# Patient Record
Sex: Female | Born: 1975 | Race: White | Hispanic: No | Marital: Single | Smoking: Never smoker
Health system: Southern US, Community
[De-identification: ages and names within clinical notes are randomized; demographics above are authoritative.]

## PROBLEM LIST (undated history)

## (undated) DIAGNOSIS — F32A Depression, unspecified: Secondary | ICD-10-CM

## (undated) DIAGNOSIS — F419 Anxiety disorder, unspecified: Secondary | ICD-10-CM

## (undated) DIAGNOSIS — D649 Anemia, unspecified: Secondary | ICD-10-CM

## (undated) DIAGNOSIS — N3090 Cystitis, unspecified without hematuria: Secondary | ICD-10-CM

## (undated) DIAGNOSIS — F329 Major depressive disorder, single episode, unspecified: Secondary | ICD-10-CM

## (undated) DIAGNOSIS — M26629 Arthralgia of temporomandibular joint, unspecified side: Secondary | ICD-10-CM

## (undated) DIAGNOSIS — A6009 Herpesviral infection of other urogenital tract: Secondary | ICD-10-CM

## (undated) DIAGNOSIS — F909 Attention-deficit hyperactivity disorder, unspecified type: Secondary | ICD-10-CM

## (undated) HISTORY — DX: Anxiety disorder, unspecified: F41.9

## (undated) HISTORY — DX: Depression, unspecified: F32.A

## (undated) HISTORY — PX: WISDOM TOOTH EXTRACTION: SHX21

## (undated) HISTORY — DX: Anemia, unspecified: D64.9

## (undated) HISTORY — DX: Cystitis, unspecified without hematuria: N30.90

## (undated) HISTORY — DX: Major depressive disorder, single episode, unspecified: F32.9

## (undated) HISTORY — DX: Attention-deficit hyperactivity disorder, unspecified type: F90.9

## (undated) HISTORY — DX: Herpesviral infection of other urogenital tract: A60.09

## (undated) HISTORY — DX: Arthralgia of temporomandibular joint, unspecified side: M26.629

---

## 2004-11-19 ENCOUNTER — Encounter: Admission: RE | Admit: 2004-11-19 | Discharge: 2004-11-19 | Payer: Self-pay | Admitting: Family Medicine

## 2004-12-12 ENCOUNTER — Other Ambulatory Visit: Admission: RE | Admit: 2004-12-12 | Discharge: 2004-12-12 | Payer: Self-pay | Admitting: Family Medicine

## 2005-05-04 HISTORY — PX: COLONOSCOPY: SHX174

## 2005-12-28 ENCOUNTER — Ambulatory Visit: Payer: Self-pay | Admitting: Family Medicine

## 2006-03-10 ENCOUNTER — Ambulatory Visit: Payer: Self-pay | Admitting: Family Medicine

## 2006-03-10 ENCOUNTER — Other Ambulatory Visit: Admission: RE | Admit: 2006-03-10 | Discharge: 2006-03-10 | Payer: Self-pay | Admitting: Family Medicine

## 2006-09-15 ENCOUNTER — Ambulatory Visit: Payer: Self-pay | Admitting: Family Medicine

## 2006-09-16 ENCOUNTER — Ambulatory Visit: Payer: Self-pay | Admitting: Family Medicine

## 2007-05-16 ENCOUNTER — Ambulatory Visit: Payer: Self-pay | Admitting: Family Medicine

## 2007-05-16 ENCOUNTER — Other Ambulatory Visit: Admission: RE | Admit: 2007-05-16 | Discharge: 2007-05-16 | Payer: Self-pay | Admitting: Family Medicine

## 2007-12-14 ENCOUNTER — Ambulatory Visit: Payer: Self-pay | Admitting: Family Medicine

## 2008-03-01 ENCOUNTER — Ambulatory Visit: Payer: Self-pay | Admitting: Family Medicine

## 2008-04-10 ENCOUNTER — Ambulatory Visit: Payer: Self-pay | Admitting: Family Medicine

## 2008-05-04 HISTORY — PX: KNEE SURGERY: SHX244

## 2008-08-09 ENCOUNTER — Other Ambulatory Visit: Admission: RE | Admit: 2008-08-09 | Discharge: 2008-08-09 | Payer: Self-pay | Admitting: Family Medicine

## 2008-08-09 ENCOUNTER — Ambulatory Visit: Payer: Self-pay | Admitting: Family Medicine

## 2008-08-13 ENCOUNTER — Encounter: Admission: RE | Admit: 2008-08-13 | Discharge: 2008-08-13 | Payer: Self-pay | Admitting: Family Medicine

## 2009-01-14 ENCOUNTER — Ambulatory Visit: Payer: Self-pay | Admitting: Family Medicine

## 2009-03-08 ENCOUNTER — Encounter: Admission: RE | Admit: 2009-03-08 | Discharge: 2009-03-08 | Payer: Self-pay | Admitting: Family Medicine

## 2009-03-27 ENCOUNTER — Ambulatory Visit: Payer: Self-pay | Admitting: Family Medicine

## 2009-06-18 ENCOUNTER — Ambulatory Visit: Payer: Self-pay | Admitting: Family Medicine

## 2009-07-03 ENCOUNTER — Ambulatory Visit: Payer: Self-pay | Admitting: Physician Assistant

## 2009-10-22 ENCOUNTER — Other Ambulatory Visit: Admission: RE | Admit: 2009-10-22 | Discharge: 2009-10-22 | Payer: Self-pay | Admitting: Family Medicine

## 2009-10-22 ENCOUNTER — Ambulatory Visit: Payer: Self-pay | Admitting: Physician Assistant

## 2009-11-25 ENCOUNTER — Encounter: Admission: RE | Admit: 2009-11-25 | Discharge: 2009-11-25 | Payer: Self-pay | Admitting: Oral Surgery

## 2010-04-16 ENCOUNTER — Ambulatory Visit: Payer: Self-pay | Admitting: Family Medicine

## 2010-05-27 ENCOUNTER — Ambulatory Visit
Admission: RE | Admit: 2010-05-27 | Discharge: 2010-05-27 | Payer: Self-pay | Source: Home / Self Care | Attending: Family Medicine | Admitting: Family Medicine

## 2010-07-29 ENCOUNTER — Ambulatory Visit (INDEPENDENT_AMBULATORY_CARE_PROVIDER_SITE_OTHER): Payer: 59 | Admitting: Family Medicine

## 2010-07-29 DIAGNOSIS — R6884 Jaw pain: Secondary | ICD-10-CM

## 2010-08-25 ENCOUNTER — Other Ambulatory Visit (HOSPITAL_COMMUNITY)
Admission: RE | Admit: 2010-08-25 | Discharge: 2010-08-25 | Disposition: A | Discharge status: Home / Self Care | Payer: 59 | Source: Ambulatory Visit | Attending: Family Medicine | Admitting: Family Medicine

## 2010-08-25 ENCOUNTER — Ambulatory Visit (INDEPENDENT_AMBULATORY_CARE_PROVIDER_SITE_OTHER): Payer: 59 | Admitting: Family Medicine

## 2010-08-25 ENCOUNTER — Other Ambulatory Visit: Payer: Self-pay | Admitting: Family Medicine

## 2010-08-25 DIAGNOSIS — N921 Excessive and frequent menstruation with irregular cycle: Secondary | ICD-10-CM

## 2010-08-25 DIAGNOSIS — N760 Acute vaginitis: Secondary | ICD-10-CM

## 2010-08-25 DIAGNOSIS — Z124 Encounter for screening for malignant neoplasm of cervix: Secondary | ICD-10-CM | POA: Insufficient documentation

## 2010-08-26 LAB — HM PAP SMEAR: HM Pap smear: NEGATIVE

## 2010-11-19 ENCOUNTER — Encounter: Payer: Self-pay | Admitting: Family Medicine

## 2010-11-20 ENCOUNTER — Encounter: Payer: Self-pay | Admitting: Family Medicine

## 2010-11-20 ENCOUNTER — Ambulatory Visit (INDEPENDENT_AMBULATORY_CARE_PROVIDER_SITE_OTHER): Payer: 59 | Admitting: Family Medicine

## 2010-11-20 VITALS — BP 122/70 | HR 68 | Ht 65.0 in | Wt 176.0 lb

## 2010-11-20 DIAGNOSIS — R6884 Jaw pain: Secondary | ICD-10-CM

## 2010-11-20 DIAGNOSIS — S0300XA Dislocation of jaw, unspecified side, initial encounter: Secondary | ICD-10-CM | POA: Insufficient documentation

## 2010-11-20 DIAGNOSIS — F329 Major depressive disorder, single episode, unspecified: Secondary | ICD-10-CM

## 2010-11-20 DIAGNOSIS — F3289 Other specified depressive episodes: Secondary | ICD-10-CM | POA: Insufficient documentation

## 2010-11-20 MED ORDER — HYDROCODONE-ACETAMINOPHEN 5-500 MG PO TABS: 1.0000 | ORAL_TABLET | Freq: Four times a day (QID) | ORAL | Status: AC | PRN

## 2010-11-20 MED ORDER — NAPROXEN 500 MG PO TABS: 500.0000 mg | ORAL_TABLET | Freq: Two times a day (BID) | ORAL | Status: AC

## 2010-11-20 NOTE — Patient Instructions (Signed)
Ask your TMJ physician if he has any recommendations for physical therapists in town for TMJ treatment.  Stop all pain medications that are over the counter while taking the prescription anti-inflammatories.  You may use Tylenol products along with the anti-inflammatory.  I'm giving you a prescription for a narcotic--use this just for severe pain. It may cause sedation, as well as constipation and other side effects--do not take this while working, and don't take tylenol along with this pain medication (it contains acetominophen).  Consider switching from Prozac to Cymbalta.  You would need to stop the Prozac for 7-10 days before changing medication ("washout period").  We would start the Cymbalta at 30mg  for at least a week, and then increase to 60mg .  We can get you started on Samples if you would like.  Anti-inflammatories-->physical therapy-->possible injection-->(surgery)

## 2010-11-20 NOTE — Progress Notes (Signed)
Patient presents for follow up on Prozac.  This was started in 04/2010, and finds that it is working well.  At the time she felt overwhelmed with life stressors (job, family).  Currently denies anxiety, having some issues with feeling down related to ongoing pain from TMJ, and a little from work.  Denies any side effects to the medication.  Patient has had ongoing problems with TMJ.  Has been having pain L>R.  Has tried many therapies--has been to ENT, TMJ specialist (Dr. Lawerance Bach), 2 oral surgeons (Dr. Warren Danes, Dr. Barbette Merino).  Has not yet had PT, injections or surgery.  She has been eating soft food diet x 6 months, even eating baby food.  Ibuprofen and Aleve don't help (hasn't tried rx equivalent dose).  Patient appears quite frustrated due to chronic pain.    Past Medical History  Diagnosis Date  . Anxiety   . TMJ pain dysfunction syndrome   . Herpes genitalis in women     Past Surgical History  Procedure Date  . Knee surgery 2010    arthroscopic and lateral release of L knee  . Colonoscopy 2007    Dr. Elnoria Howard    History   Social History  . Marital Status: Single    Spouse Name: N/A    Number of Children: 0  . Years of Education: N/A   Occupational History  . 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 Smoking status: Never Smoker   . Smokeless tobacco: Never Used  . Alcohol Use: Yes     3 drinks per week.  . Drug Use: No  . Sexually Active: Yes    Birth Control/ Protection: Condom   Other Topics Concern  . Not on file   Social History Narrative  . No narrative on file    Family History  Problem Relation Age of Onset  . Arthritis Maternal Grandmother   . Diabetes Maternal Grandmother   . Alzheimer's disease Maternal Grandmother   . Arthritis Maternal Grandfather   . Arthritis Paternal Grandmother   . Arthritis Paternal Grandfather   . Cancer Father     CLL and skin cancer    Current outpatient prescriptions:FLUoxetine  (PROZAC) 20 MG tablet, Take 20 mg by mouth daily.  , Disp: , Rfl: ;  HYDROcodone-acetaminophen (VICODIN) 5-500 MG per tablet, Take 1 tablet by mouth every 6 (six) hours as needed for pain., Disp: 30 tablet, Rfl: 0;  naproxen (NAPROSYN) 500 MG tablet, Take 1 tablet (500 mg total) by mouth 2 (two) times daily with a meal., Disp: 30 tablet, Rfl: 2  No Known Allergies  ROS: Denies fevers, weight changes, URI symptoms, chest pain, Nausea, vomiting, abdominal pain, or other concerns  PHYSICAL EXAM: BP 122/70  Pulse 68  Ht 5\' 5"  (1.651 m)  Wt 176 lb (79.833 kg)  BMI 29.29 kg/m2  LMP 11/12/2010 Pleasant female in no distress, in fire department uniform Psych: normal mood, affect, hygiene, grooming.  Normal speech and eye contact HEENT: PERRL, EOMI, conjunctiva clear.  TM's tender bilaterally, no popping, but decreased ROM.  Also some tenderness over masseter muscles Neck: no lymphadenopathy or mass  ASSESSMENT/PLAN: 1. TMJ (dislocation of temporomandibular joint)  naproxen (NAPROSYN) 500 MG tablet, HYDROcodone-acetaminophen (VICODIN) 5-500 MG per tablet  2. Depressive disorder, not elsewhere classified     Discussed continuing Prozac for depression.  Recommended considering trial of changing to Cymbalta (after washout period of Prozac) to see if she gets pain relief.  Discussed Rx strength  NSAID's and PT.  She will call her TMJ doctor to get physical therapist recommendation.  If no effect from these therapies, we discussed possibility of injections (to be done by her TMJ doctor), versus surgery; she prefers to avoid the latter two options, if possible.  Reviewed NSAID precautions.  Discussed risks of narcotics, and to use sparingly, and not while on the job.  30 minutes visit, almost all of which was counseling

## 2010-11-25 ENCOUNTER — Ambulatory Visit: Payer: Self-pay | Admitting: Family Medicine

## 2011-07-17 ENCOUNTER — Other Ambulatory Visit: Payer: Self-pay | Admitting: Family Medicine

## 2011-07-17 NOTE — Telephone Encounter (Signed)
Is this ok?

## 2011-12-22 ENCOUNTER — Encounter: Payer: Self-pay | Admitting: Obstetrics and Gynecology

## 2011-12-22 ENCOUNTER — Ambulatory Visit (INDEPENDENT_AMBULATORY_CARE_PROVIDER_SITE_OTHER): Payer: 59 | Admitting: Obstetrics and Gynecology

## 2011-12-22 VITALS — BP 110/70 | Ht 65.0 in | Wt 175.0 lb

## 2011-12-22 DIAGNOSIS — Z01419 Encounter for gynecological examination (general) (routine) without abnormal findings: Secondary | ICD-10-CM

## 2011-12-22 MED ORDER — NYSTATIN-TRIAMCINOLONE 100000-0.1 UNIT/GM-% EX OINT: TOPICAL_OINTMENT | Freq: Three times a day (TID) | CUTANEOUS | Status: AC | PRN

## 2011-12-22 MED ORDER — AMBULATORY NON FORMULARY MEDICATION: 1.0000 | Status: DC

## 2011-12-22 NOTE — Progress Notes (Signed)
The patient reports:normal menses, no abnormal bleeding, pelvic pain or discharge  Contraception:condoms  Last mammogram: patient has never had a mammogram  Last pap: was normal April  2012  GC/Chlamydia cultures offered: declined HIV/RPR/HbsAg offered:  declined HSV 1 and 2 glycoprotein offered: declined  Menstrual cycle regular and monthly: Yes Menstrual flow normal: Yes  Urinary symptoms: none Normal bowel movements: Yes Reports abuse at home: No:   Subjective:    Cheryl Woodard is a 36 y.o. female, G0P0, who presents for an annual exam. Referred by Loma [place]. Concerned about repetitive yeast infection with discharge, burning and irritation almost monthly. Also reports "cysts" in vulva.    History   Social History  . Marital Status: Single    Spouse Name: N/A    Number of Children: 0  . Years of Education: N/A   Occupational History  . 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 Smoking status: Never Smoker   . Smokeless tobacco: Never Used  . Alcohol Use: Yes     3 drinks per week.  . Drug Use: No  . Sexually Active: Yes    Birth Control/ Protection: Condom   Other Topics Concern  . None   Social History Narrative  . None    Menstrual cycle:   LMP: Patient's last menstrual period was 12/11/2011.           Cycle: normal, mild dysmenorrhea, some positional dyspareunia  The following portions of the patient's history were reviewed and updated as appropriate: allergies, current medications, past family history, past medical history, past social history, past surgical history and problem list.  Review of Systems Pertinent items are noted in HPI. Breast:Negative for breast lump,nipple discharge or nipple retraction Gastrointestinal: Negative for abdominal pain, change in bowel habits or rectal bleeding Urinary:negative   Objective:    BP 110/70  Ht 5\' 5"  (1.651 m)  Wt 175 lb (79.379 kg)  BMI 29.12 kg/m2  LMP 12/11/2011     Weight:  Wt Readings from Last 1 Encounters:  12/22/11 175 lb (79.379 kg)          BMI: Body mass index is 29.12 kg/(m^2).  General Appearance: Alert, appropriate appearance for age. No acute distress HEENT: Grossly normal Neck / Thyroid: Supple, no masses, nodes or enlargement Lungs: clear to auscultation bilaterally Back: No CVA tenderness Breast Exam: No masses or nodes.No dimpling, nipple retraction or discharge. Cardiovascular: Regular rate and rhythm. S1, S2, no murmur Gastrointestinal: Soft, non-tender, no masses or organomegaly Pelvic Exam: Vulva and vagina appear normal. Bimanual exam reveals normal uterus and adnexa. Rectovaginal: not indicated Lymphatic Exam: Non-palpable nodes in neck, clavicular, axillary, or inguinal regions Skin: no rash or abnormalities Neurologic: Normal gait and speech, no tremor  Psychiatric: Alert and oriented, appropriate affect.     Assessment:    Normal gyn exam recurrent yeast infection vs herpes outbreak    Plan:    pap smear return annually or prn STD screening: declined Contraception:no method Yeast recommendations reviewed: if failed, consider Valtrex daily      Lev Cervone AMD

## 2011-12-23 LAB — PAP IG W/ RFLX HPV ASCU

## 2012-02-24 ENCOUNTER — Ambulatory Visit (INDEPENDENT_AMBULATORY_CARE_PROVIDER_SITE_OTHER): Payer: 59 | Admitting: Obstetrics and Gynecology

## 2012-02-24 ENCOUNTER — Telehealth: Payer: Self-pay | Admitting: Obstetrics and Gynecology

## 2012-02-24 ENCOUNTER — Encounter: Payer: Self-pay | Admitting: Obstetrics and Gynecology

## 2012-02-24 VITALS — BP 100/68 | Temp 99.0°F | Wt 174.0 lb

## 2012-02-24 DIAGNOSIS — N762 Acute vulvitis: Secondary | ICD-10-CM

## 2012-02-24 DIAGNOSIS — N760 Acute vaginitis: Secondary | ICD-10-CM

## 2012-02-24 LAB — POCT WET PREP (WET MOUNT)
Trichomonas Wet Prep HPF POC: NEGATIVE
Whiff Test: NEGATIVE

## 2012-02-24 MED ORDER — VALACYCLOVIR HCL 500 MG PO TABS: ORAL_TABLET | ORAL | Status: DC

## 2012-02-24 NOTE — Patient Instructions (Signed)
Cetaphil Hydrating Body Lotion Wash Cerave Hydrating Cleanser

## 2012-02-24 NOTE — Progress Notes (Signed)
36 YO for possible HSV outbreak states that her outside vaginal area is always irritated, especially after intercourse.  Has been diagnosed with vaginal  eczema with some relief from steroid creams. For past 3 years has had this problem and vaginal dryness.    O: Pelvic; EGBUS-left inferior labia with cluster of tender vesicles (intact), vagina-white discharge, cervix-no lesions, uterus/adnexae-no masses/tenderness  Wet Prep: pH-4.5,  whiff-negative,  no clue, trichomoniasis or yeast  A:  HSV Outbreak      Vaginal Dryness   P:  Will discuss possible vaginal E2, colposcopy etc. with Dr.Rivard        Trial of Valtrex 500 mg bid x 5 days then qd x 3 months to see if       symptoms resolve.         Reviewed perineal hygiene-patient is compliant        RTO-TBA  Cheryl Feider, PA-C

## 2012-02-26 ENCOUNTER — Telehealth: Payer: Self-pay

## 2012-02-26 LAB — HERPES SIMPLEX VIRUS CULTURE: Organism ID, Bacteria: DETECTED

## 2012-02-26 NOTE — Telephone Encounter (Signed)
LM FOR PT TO CALL BACK CONCERNING HSV1.

## 2012-02-26 NOTE — Telephone Encounter (Signed)
TC TO PT REGARDING HSV 1 RESULTS. 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 PT VOICED UNDERSTANDING.

## 2012-02-26 NOTE — Telephone Encounter (Signed)
Message copied by Winfred Leeds on Fri Feb 26, 2012  2:54 PM ------      Message from: Henreitta Leber      Created: Fri Feb 26, 2012  1:07 PM       Advise patient of HSV-1 culture results.  Thanks,  EP

## 2013-01-09 ENCOUNTER — Other Ambulatory Visit: Payer: Self-pay | Admitting: Occupational Medicine

## 2013-01-09 ENCOUNTER — Ambulatory Visit
Admission: RE | Admit: 2013-01-09 | Discharge: 2013-01-09 | Disposition: A | Discharge status: Home / Self Care | Payer: Self-pay | Source: Ambulatory Visit | Attending: Occupational Medicine | Admitting: Occupational Medicine

## 2013-01-09 DIAGNOSIS — T148XXA Other injury of unspecified body region, initial encounter: Secondary | ICD-10-CM

## 2013-03-13 DIAGNOSIS — F909 Attention-deficit hyperactivity disorder, unspecified type: Secondary | ICD-10-CM

## 2013-03-13 HISTORY — DX: Attention-deficit hyperactivity disorder, unspecified type: F90.9

## 2013-03-17 ENCOUNTER — Encounter: Payer: Self-pay | Admitting: Internal Medicine

## 2014-03-15 ENCOUNTER — Other Ambulatory Visit: Payer: Self-pay | Admitting: Physician Assistant

## 2014-04-17 ENCOUNTER — Emergency Department (HOSPITAL_COMMUNITY)
Admission: EM | Admit: 2014-04-17 | Discharge: 2014-04-17 | Disposition: A | Discharge status: Home / Self Care | Payer: 59 | Source: Home / Self Care | Attending: Family Medicine | Admitting: Family Medicine

## 2014-04-17 ENCOUNTER — Encounter (HOSPITAL_COMMUNITY): Payer: Self-pay | Admitting: Emergency Medicine

## 2014-04-17 DIAGNOSIS — J00 Acute nasopharyngitis [common cold]: Secondary | ICD-10-CM

## 2014-04-17 DIAGNOSIS — H109 Unspecified conjunctivitis: Secondary | ICD-10-CM

## 2014-04-17 MED ORDER — POLYMYXIN B-TRIMETHOPRIM 10000-0.1 UNIT/ML-% OP SOLN
1.0000 [drp] | OPHTHALMIC | Status: DC
Start: 2014-04-17 — End: 2020-12-16

## 2014-04-17 NOTE — Discharge Instructions (Signed)
Your eye symptoms are likely related to your cold. If symptoms become worse or discharge develops throughout the day, please begin using medication as prescribed.  Conjunctivitis Conjunctivitis is commonly called "pink eye." Conjunctivitis can be caused by bacterial or viral infection, allergies, or injuries. There is usually redness of the lining of the eye, itching, discomfort, and sometimes discharge. There may be deposits of matter along the eyelids. A viral infection usually causes a watery discharge, while a bacterial infection causes a yellowish, thick discharge. Pink eye is very contagious and spreads by direct contact. You may be given antibiotic eyedrops as part of your treatment. Before using your eye medicine, remove all drainage from the eye by washing gently with warm water and cotton balls. Continue to use the medication until you have awakened 2 mornings in a row without discharge from the eye. Do not rub your eye. This increases the irritation and helps spread infection. Use separate towels from other household members. Wash your hands with soap and water before and after touching your eyes. Use cold compresses to reduce pain and sunglasses to relieve irritation from light. Do not wear contact lenses or wear eye makeup until the infection is gone. SEEK MEDICAL CARE IF:   Your symptoms are not better after 3 days of treatment.  You have increased pain or trouble seeing.  The outer eyelids become very red or swollen. Document Released: 05/28/2004 Document Revised: 07/13/2011 Document Reviewed: 04/20/2005 [facility] Patient Information 2015 [place], [place]. This information is not intended to replace advice given to you by your health care provider. Make sure you discuss any questions you have with your health care provider.  Upper Respiratory Infection, Adult An upper respiratory infection (URI) is also sometimes known as the common cold. The upper respiratory tract includes the nose,  sinuses, throat, trachea, and bronchi. Bronchi are the airways leading to the lungs. Most people improve within 1 week, but symptoms can last up to 2 weeks. A residual cough may last even longer.  CAUSES Many different viruses can infect the tissues lining the upper respiratory tract. The tissues become irritated and inflamed and often become very moist. Mucus production is also common. A cold is contagious. You can easily spread the virus to others by oral contact. This includes kissing, sharing a glass, coughing, or sneezing. Touching your mouth or nose and then touching a surface, which is then touched by another person, can also spread the virus. SYMPTOMS  Symptoms typically develop 1 to 3 days after you come in contact with a cold virus. Symptoms vary from person to person. They may include:  Runny nose.  Sneezing.  Nasal congestion.  Sinus irritation.  Sore throat.  Loss of voice (laryngitis).  Cough.  Fatigue.  Muscle aches.  Loss of appetite.  Headache.  Low-grade fever. DIAGNOSIS  You might diagnose your own cold based on familiar symptoms, since most people get a cold 2 to 3 times a year. Your caregiver can confirm this based on your exam. Most importantly, your caregiver can check that your symptoms are not due to another disease such as strep throat, sinusitis, pneumonia, asthma, or epiglottitis. Blood tests, throat tests, and X-rays are not necessary to diagnose a common cold, but they may sometimes be helpful in excluding other more serious diseases. Your caregiver will decide if any further tests are required. RISKS AND COMPLICATIONS  You may be at risk for a more severe case of the common cold if you smoke cigarettes, have chronic heart disease (  such as heart failure) or lung disease (such as asthma), or if you have a weakened immune system. The very young and very old are also at risk for more serious infections. Bacterial sinusitis, middle ear infections, and  bacterial pneumonia can complicate the common cold. The common cold can worsen asthma and chronic obstructive pulmonary disease (COPD). Sometimes, these complications can require emergency medical care and may be life-threatening. PREVENTION  The best way to protect against getting a cold is to practice good hygiene. Avoid oral or hand contact with people with cold symptoms. Wash your hands often if contact occurs. There is no clear evidence that vitamin C, vitamin E, echinacea, or exercise reduces the chance of developing a cold. However, it is always recommended to get plenty of rest and practice good nutrition. TREATMENT  Treatment is directed at relieving symptoms. There is no cure. Antibiotics are not effective, because the infection is caused by a virus, not by bacteria. Treatment may include:  Increased fluid intake. Sports drinks offer valuable electrolytes, sugars, and fluids.  Breathing heated mist or steam (vaporizer or shower).  Eating chicken soup or other clear broths, and maintaining good nutrition.  Getting plenty of rest.  Using gargles or lozenges for comfort.  Controlling fevers with ibuprofen or acetaminophen as directed by your caregiver.  Increasing usage of your inhaler if you have asthma. Zinc gel and zinc lozenges, taken in the first 24 hours of the common cold, can shorten the duration and lessen the severity of symptoms. Pain medicines may help with fever, muscle aches, and throat pain. A variety of non-prescription medicines are available to treat congestion and runny nose. Your caregiver can make recommendations and may suggest nasal or lung inhalers for other symptoms.  HOME CARE INSTRUCTIONS   Only take over-the-counter or prescription medicines for pain, discomfort, or fever as directed by your caregiver.  Use a warm mist humidifier or inhale steam from a shower to increase air moisture. This may keep secretions moist and make it easier to breathe.  Drink  enough water and fluids to keep your urine clear or pale yellow.  Rest as needed.  Return to work when your temperature has returned to normal or as your caregiver advises. You may need to stay home longer to avoid infecting others. You can also use a face mask and careful hand washing to prevent spread of the virus. SEEK MEDICAL CARE IF:   After the first few days, you feel you are getting worse rather than better.  You need your caregiver's advice about medicines to control symptoms.  You develop chills, worsening shortness of breath, or brown or red sputum. These may be signs of pneumonia.  You develop yellow or brown nasal discharge or pain in the face, especially when you bend forward. These may be signs of sinusitis.  You develop a fever, swollen neck glands, pain with swallowing, or white areas in the back of your throat. These may be signs of strep throat. SEEK IMMEDIATE MEDICAL CARE IF:   You have a fever.  You develop severe or persistent headache, ear pain, sinus pain, or chest pain.  You develop wheezing, a prolonged cough, cough up blood, or have a change in your usual mucus (if you have chronic lung disease).  You develop sore muscles or a stiff neck. Document Released: 10/14/2000 Document Revised: 07/13/2011 Document Reviewed: 07/26/2013 [facility] Patient Information 2015 [place], [place]. This information is not intended to replace advice given to you by your health care provider.  Make sure you discuss any questions you have with your health care provider. ° °

## 2014-04-17 NOTE — ED Provider Notes (Signed)
CSN: [id number]     Arrival date & time 04/17/14  1038 History   First MD Initiated Contact with Patient 04/17/14 1106     Chief Complaint  Patient presents with  . URI   (Consider location/radiation/quality/duration/timing/severity/associated sxs/prior Treatment) HPI Comments: PCP: none Otherwise healthy Non-smoker  Patient is a 38 y.o. female presenting with URI. The history is provided by the patient.  URI Presenting symptoms: congestion, cough, rhinorrhea and sore throat   Presenting symptoms: no fever   Severity:  Mild Onset quality:  Gradual Duration:  5 days Timing:  Constant Progression:  Improving Chronicity:  New Associated symptoms comment:  Bilateral eye irritation x 2 days   Past Medical History  Diagnosis Date  . Anxiety   . TMJ pain dysfunction syndrome   . 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 Bladder infection   . Anemia   . Depression   . ADHD (attention deficit hyperactivity disorder) 03/13/13   Past Surgical History  Procedure Laterality Date  . Knee surgery  2010    arthroscopic and lateral release of L knee  . Colonoscopy  2007    Dr. Benson Norway  . Wisdom tooth extraction     Family History  Problem Relation Age of Onset  . Arthritis Maternal Grandmother   . Diabetes Maternal Grandmother   . Alzheimer's disease Maternal Grandmother   . Arthritis Maternal Grandfather   . Arthritis Paternal Grandmother   . Depression Paternal Grandmother   . Arthritis Paternal Grandfather   . Cancer Father     CLL and skin cancer  . Hypertension Father   . Anemia Mother   . Scoliosis Sister    History  Substance Use Topics  . Smoking status: Never Smoker   . Smokeless tobacco: Never Used  . Alcohol Use: Yes     Comment: 3 drinks per week.   OB History    Gravida Para Term Preterm AB TAB SAB Ectopic Multiple Living   0              Review of Systems  Constitutional: Negative for fever.  HENT: Positive for congestion, rhinorrhea and sore throat.    Eyes: Positive for discharge and redness. Negative for photophobia, pain, itching and visual disturbance.  Respiratory: Positive for cough. Negative for chest tightness and shortness of breath.   Cardiovascular: Negative.     Allergies  Review of patient's allergies indicates no known allergies.  Home Medications   Prior to Admission medications   Medication Sig Start Date End Date Taking? Authorizing Provider  lisdexamfetamine (VYVANSE) 40 MG capsule Take 40 mg by mouth every morning.   Yes Historical Provider, MD  sertraline (ZOLOFT) 50 MG tablet Take 50 mg by mouth daily.   Yes Historical Provider, MD  AMBULATORY NON FORMULARY MEDICATION Place 1 suppository vaginally 2 (two) times a week. Medication Name: boric acid suppository 600mg  12/22/11   Delsa Bern, MD  b complex vitamins tablet Take 1 tablet by mouth daily.    Historical Provider, MD  FLUoxetine (PROZAC) 20 MG capsule TAKE ONE CAPSULE BY MOUTH ONE TIME DAILY 07/17/11   Camelia Eng Tysinger, PA-C  FLUoxetine (PROZAC) 20 MG tablet Take 20 mg by mouth daily.      Historical Provider, MD  magnesium 30 MG tablet Take 30 mg by mouth 2 (two) times daily.    Historical Provider, MD  Multiple Vitamin (MULTIVITAMIN) capsule Take 1 capsule by mouth daily.    Historical Provider, MD  trimethoprim-polymyxin b (POLYTRIM) ophthalmic solution Place 1 drop into both eyes every 4 (four) hours. X 5 days 04/17/14   Lutricia Feil, PA  valACYclovir (VALTREX) 500 MG tablet 1 po bid x 5 days then 1 po qd 02/24/12   Earnstine Regal, PA-C   BP 136/86 mmHg  Pulse 79  Temp(Src) 98.3 F (36.8 C) (Oral)  Resp 16  SpO2 100%  LMP 04/12/2014 Physical Exam  Constitutional: She is oriented to person, place, and time. She appears well-developed and well-nourished. No distress.  HENT:  Head: Normocephalic and atraumatic.  Right Ear: Hearing, tympanic membrane, external ear and ear canal normal.  Left Ear: Hearing, tympanic membrane, external ear and  ear canal normal.  Nose: Nose normal.  Mouth/Throat: Uvula is midline, oropharynx is clear and moist and mucous membranes are normal.  Eyes: Conjunctivae, EOM and lids are normal. Pupils are equal, round, and reactive to light.  Slit lamp exam:      The right eye shows no corneal abrasion, no corneal ulcer and no fluorescein uptake.       The left eye shows no corneal abrasion, no corneal ulcer and no fluorescein uptake.  Neck: Normal range of motion. Neck supple.  Cardiovascular: Normal rate, regular rhythm and normal heart sounds.   Pulmonary/Chest: Effort normal and breath sounds normal.  Musculoskeletal: Normal range of motion.  Lymphadenopathy:    She has no cervical adenopathy.  Neurological: She is alert and oriented to person, place, and time.  Skin: Skin is warm and dry. No rash noted. No erythema.  Psychiatric: She has a normal mood and affect. Her behavior is normal.  Nursing note and vitals reviewed.   ED Course  Procedures (including critical care time) Labs Review Labs Reviewed - No data to display  Imaging Review No results found.   MDM   1. Common cold   2. Bilateral conjunctivitis   Symptomatic care of resolving URI Common cold with very mild bilateral (likely viral) conjunctivitis. No contact lens use until symptoms resolved.  Advised to begin Polytrim opth if eye symptoms worsen or discharge increases.    Lutricia Feil, [place] 04/17/14 1149

## 2014-04-17 NOTE — ED Notes (Signed)
C/o cold sx onset 5 days Sx include: productive cough, congestion, runny nose Has been taking OTC cold meds w/no relief.  Also reports poss bilateral conjunctivitis onset 2 days Sx include: redness and irritations Alert, no signs of acute distress.

## 2014-06-12 DIAGNOSIS — F419 Anxiety disorder, unspecified: Secondary | ICD-10-CM | POA: Insufficient documentation

## 2014-06-12 DIAGNOSIS — F909 Attention-deficit hyperactivity disorder, unspecified type: Secondary | ICD-10-CM | POA: Insufficient documentation

## 2014-06-13 DIAGNOSIS — E559 Vitamin D deficiency, unspecified: Secondary | ICD-10-CM | POA: Insufficient documentation

## 2015-02-26 ENCOUNTER — Other Ambulatory Visit: Payer: Self-pay | Admitting: *Deleted

## 2015-02-26 ENCOUNTER — Other Ambulatory Visit: Payer: Self-pay | Admitting: Physician Assistant

## 2015-02-26 DIAGNOSIS — Z1231 Encounter for screening mammogram for malignant neoplasm of breast: Secondary | ICD-10-CM

## 2015-05-14 DIAGNOSIS — B009 Herpesviral infection, unspecified: Secondary | ICD-10-CM | POA: Insufficient documentation

## 2015-06-20 DIAGNOSIS — Z975 Presence of (intrauterine) contraceptive device: Secondary | ICD-10-CM | POA: Insufficient documentation

## 2016-06-30 ENCOUNTER — Other Ambulatory Visit: Payer: Self-pay | Admitting: Family Medicine

## 2016-06-30 DIAGNOSIS — Z1231 Encounter for screening mammogram for malignant neoplasm of breast: Secondary | ICD-10-CM

## 2016-07-17 ENCOUNTER — Ambulatory Visit
Admission: RE | Admit: 2016-07-17 | Discharge: 2016-07-17 | Disposition: A | Discharge status: Home / Self Care | Payer: BC Managed Care – PPO | Source: Ambulatory Visit | Attending: Family Medicine | Admitting: Family Medicine

## 2016-07-17 DIAGNOSIS — Z1231 Encounter for screening mammogram for malignant neoplasm of breast: Secondary | ICD-10-CM

## 2016-07-20 ENCOUNTER — Other Ambulatory Visit: Payer: Self-pay | Admitting: Family Medicine

## 2016-07-20 DIAGNOSIS — R928 Other abnormal and inconclusive findings on diagnostic imaging of breast: Secondary | ICD-10-CM

## 2016-07-23 ENCOUNTER — Ambulatory Visit
Admission: RE | Admit: 2016-07-23 | Discharge: 2016-07-23 | Disposition: A | Discharge status: Home / Self Care | Payer: BC Managed Care – PPO | Source: Ambulatory Visit | Attending: Family Medicine | Admitting: Family Medicine

## 2016-07-23 DIAGNOSIS — R928 Other abnormal and inconclusive findings on diagnostic imaging of breast: Secondary | ICD-10-CM

## 2017-05-21 ENCOUNTER — Other Ambulatory Visit: Payer: Self-pay | Admitting: Physician Assistant

## 2017-05-21 DIAGNOSIS — N6310 Unspecified lump in the right breast, unspecified quadrant: Secondary | ICD-10-CM

## 2017-07-19 ENCOUNTER — Ambulatory Visit
Admission: RE | Admit: 2017-07-19 | Discharge: 2017-07-19 | Disposition: A | Discharge status: Home / Self Care | Payer: BC Managed Care – PPO | Source: Ambulatory Visit | Attending: Physician Assistant | Admitting: Physician Assistant

## 2017-07-19 ENCOUNTER — Other Ambulatory Visit: Payer: Self-pay | Admitting: Physician Assistant

## 2017-07-19 DIAGNOSIS — N6310 Unspecified lump in the right breast, unspecified quadrant: Secondary | ICD-10-CM

## 2018-01-20 ENCOUNTER — Ambulatory Visit
Admission: RE | Admit: 2018-01-20 | Discharge: 2018-01-20 | Disposition: A | Discharge status: Home / Self Care | Payer: BC Managed Care – PPO | Source: Ambulatory Visit | Attending: Physician Assistant | Admitting: Physician Assistant

## 2018-01-20 ENCOUNTER — Other Ambulatory Visit: Payer: Self-pay | Admitting: Physician Assistant

## 2018-01-20 DIAGNOSIS — N6310 Unspecified lump in the right breast, unspecified quadrant: Secondary | ICD-10-CM

## 2018-07-25 ENCOUNTER — Other Ambulatory Visit: Payer: Self-pay

## 2019-02-07 IMAGING — MG DIGITAL DIAGNOSTIC BILATERAL MAMMOGRAM WITH TOMO AND CAD
8 series · 8 of 24 positions shown · non-contrast
Comparison: Previous exam(s).

CLINICAL DATA: Follow-up of right breast probably benign mass and
annual mammography.

EXAM:
DIGITAL DIAGNOSTIC BILATERAL MAMMOGRAM WITH CAD AND TOMO
ULTRASOUND RIGHT BREAST

[L CC synth-2D]
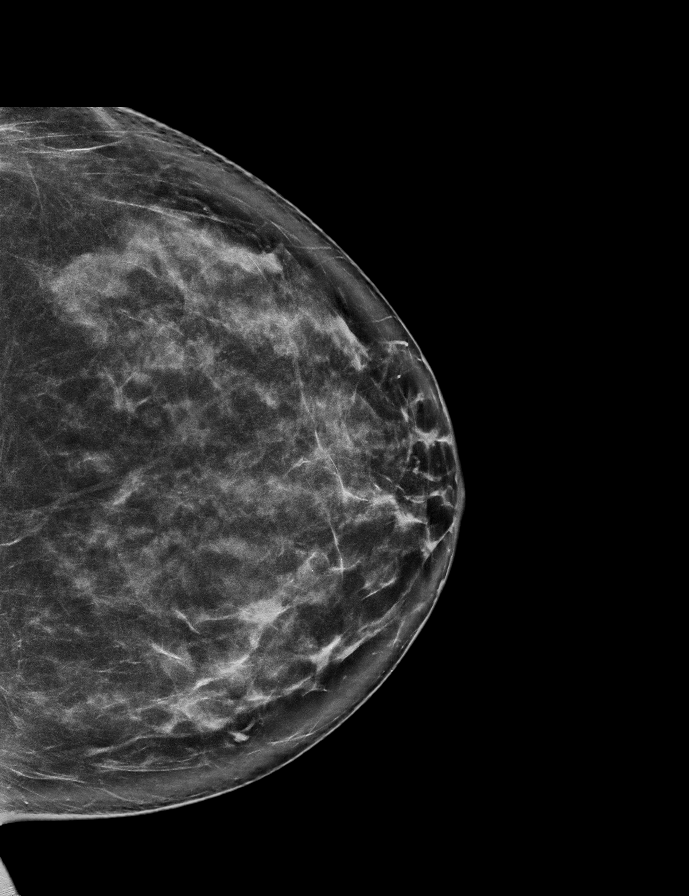

[R MLO synth-2D]
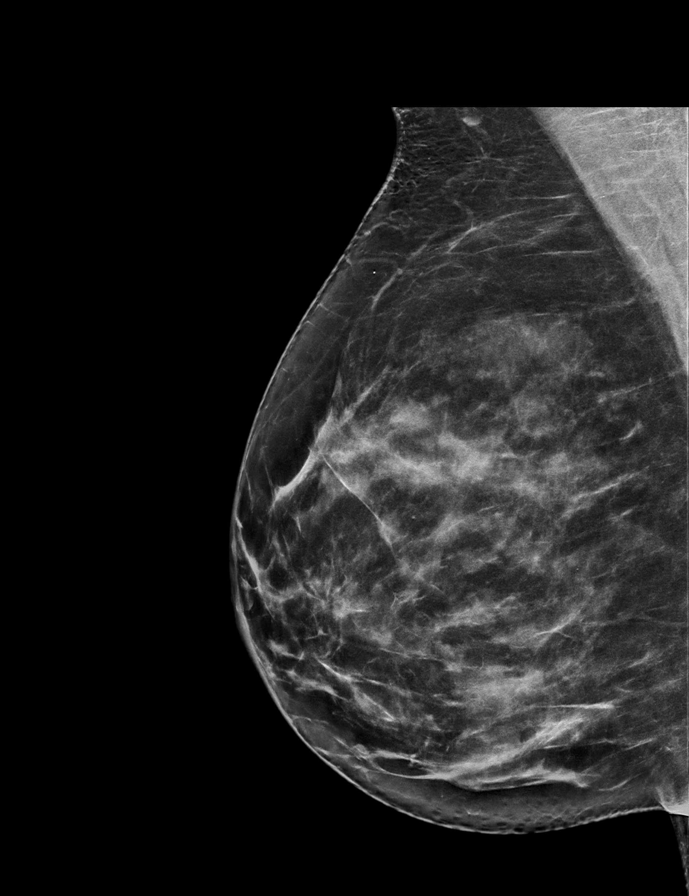

[R CC synth-2D]
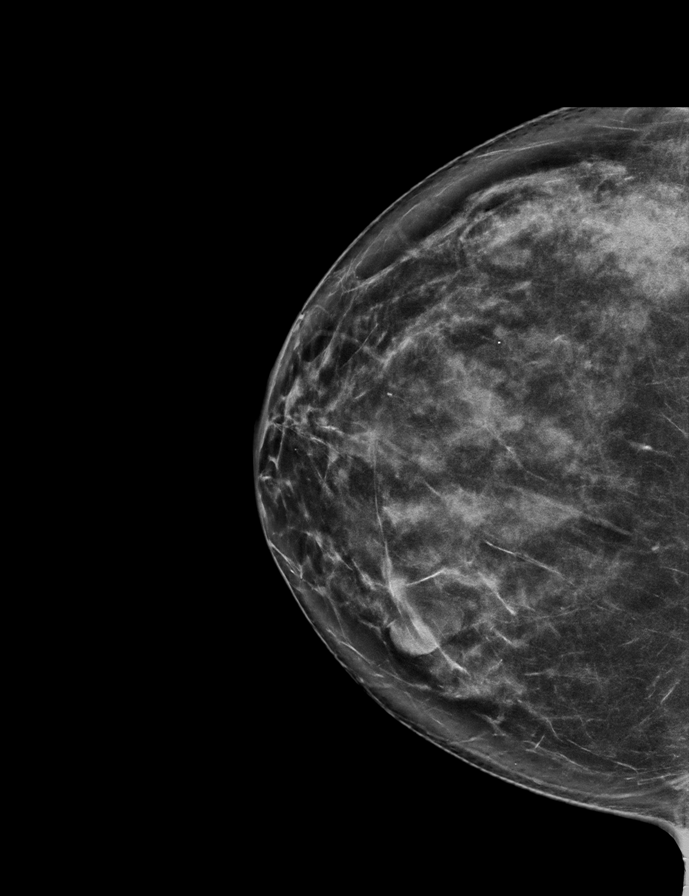

[L MLO synth-2D]
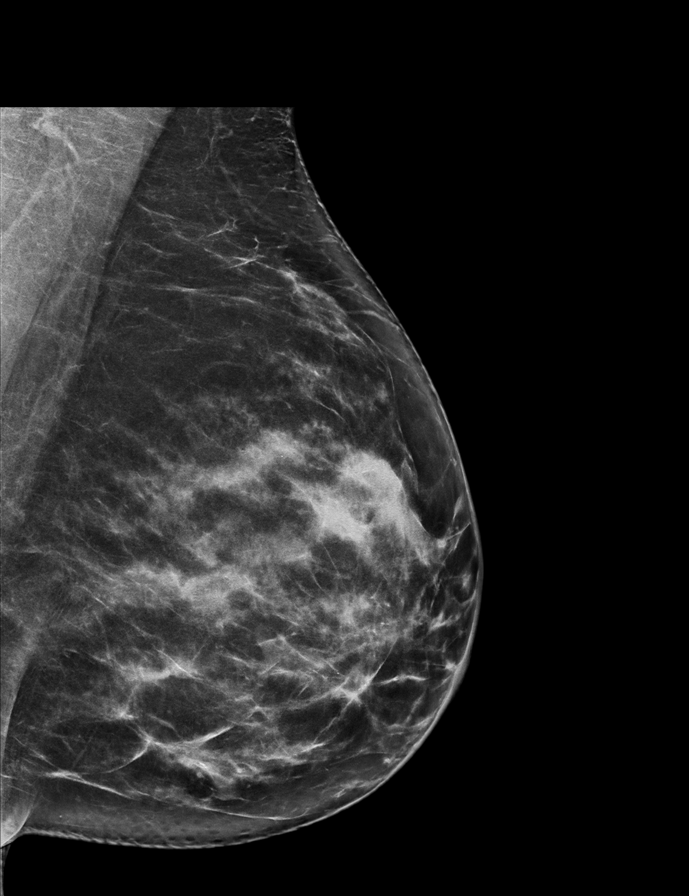

[R CC tomo · tomo slice 39/77.0]
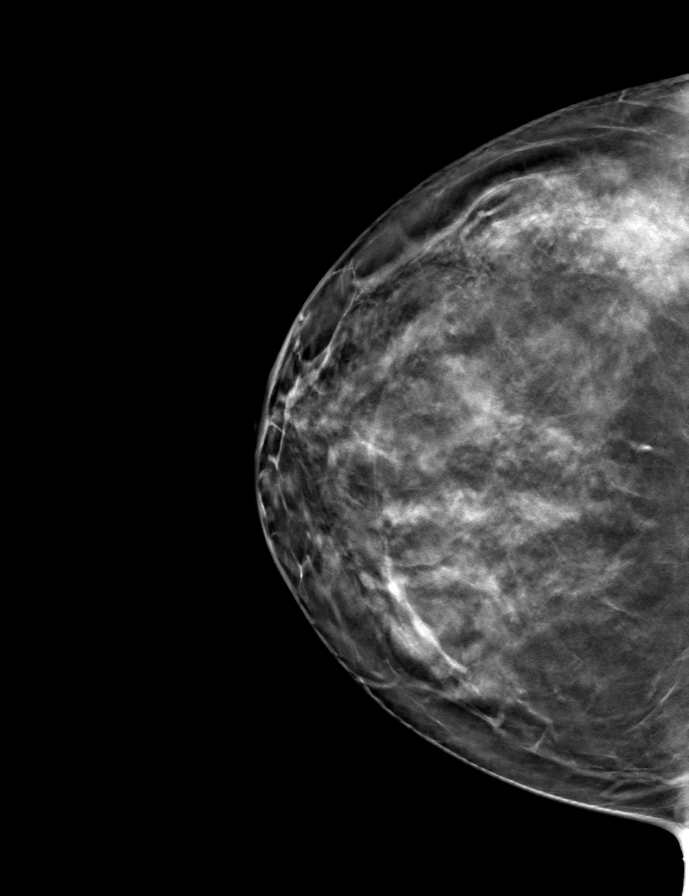

[L MLO tomo · tomo slice 39/78.0]
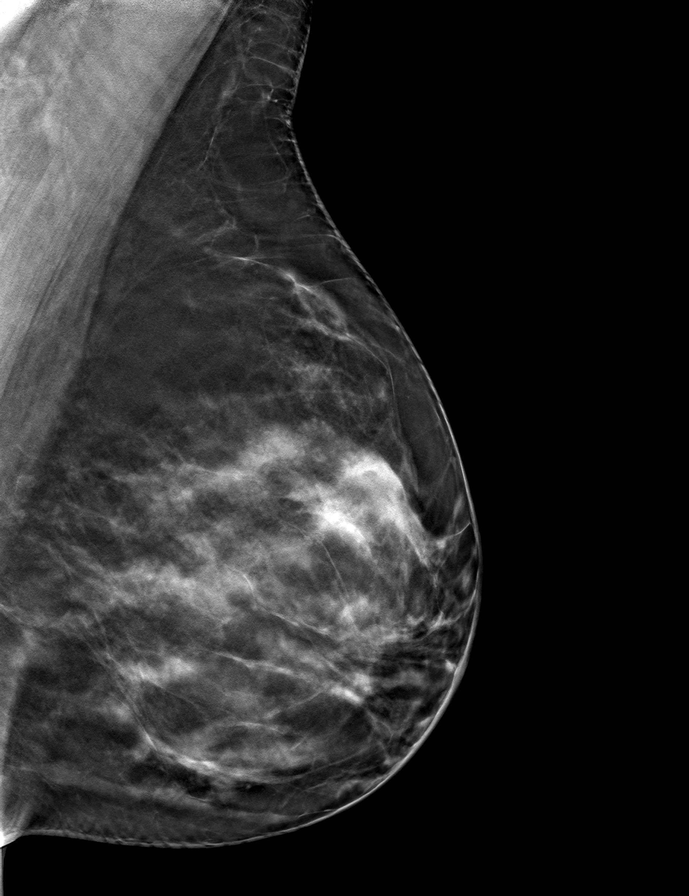

[L CC tomo · tomo slice 39/78.0]
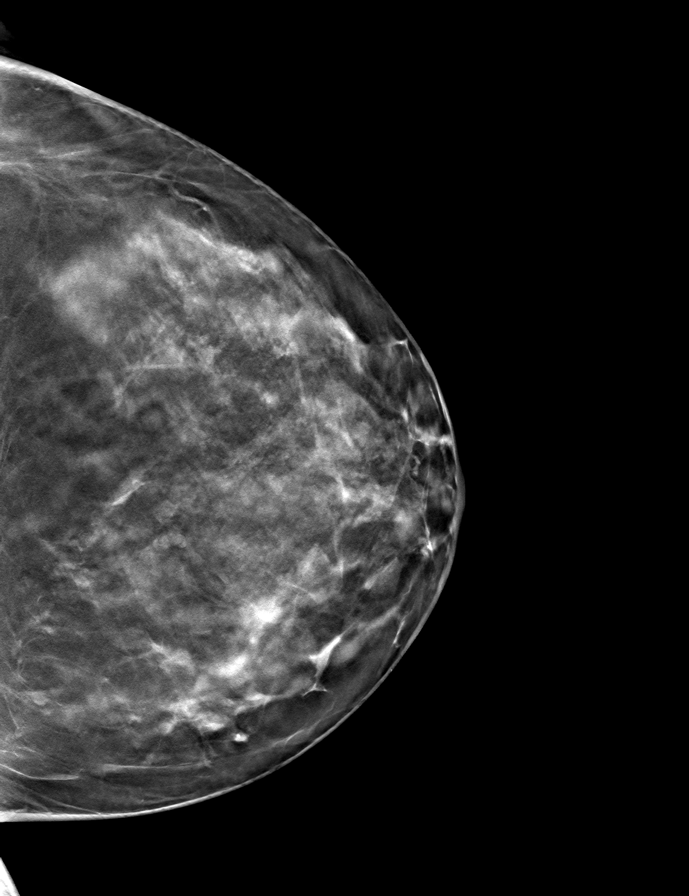

[R MLO tomo · tomo slice 39/78.0]
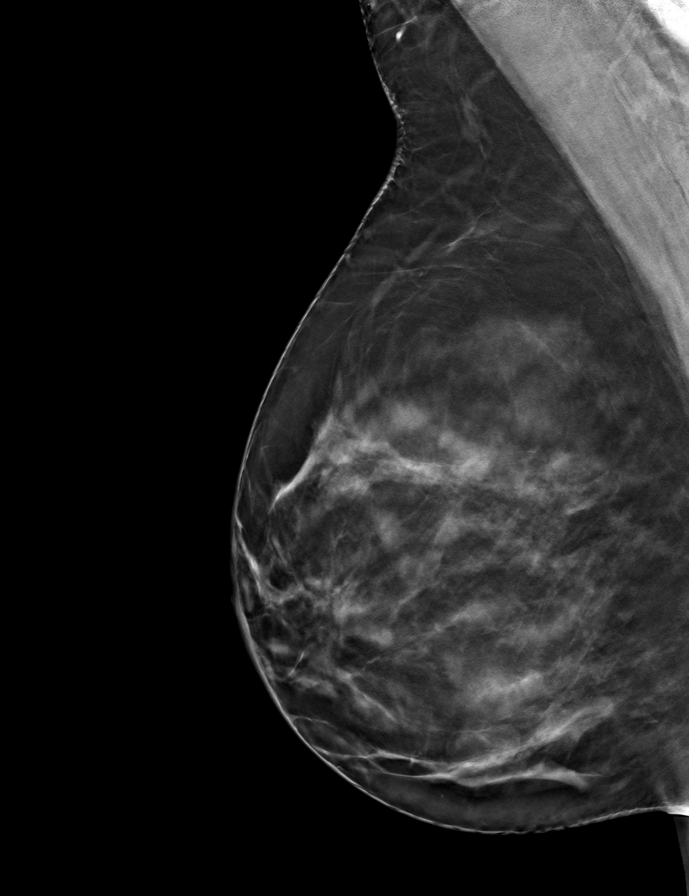

[8 of 24 positions shown; findings below may reference images not displayed]

ACR Breast Density Category c: The breast tissue is heterogeneously
dense, which may obscure small masses.
FINDINGS: Mammographically, there are no new suspicious masses, areas of
architectural distortion or microcalcifications in either breast.
There is a stable known circumscribed mass in the right breast lower
inner quadrant, middle depth.

Mammographic images were processed with CAD.

On physical exam, soft palpable approximately 1.5 cm mass in the
right slightly lower inner quadrant.

Targeted ultrasound is performed, showing right breast 3:30 o'clock
3 cm from the nipple stable benign-appearing hypoechoic mass
measuring 1.7 x 1.1 x 1.5 cm.
IMPRESSION: Stable benign-appearing right breast mass, for which continued
short-term follow-up is recommended.

RECOMMENDATION:
Diagnostic mammogram and possibly ultrasound of the right breast in
6 months. (Code:6O-3-H0O)

I have discussed the findings and recommendations with the patient.
Results were also provided in writing at the conclusion of the
visit. If applicable, a reminder letter will be sent to the patient
regarding the next appointment.

BI-RADS CATEGORY  3: Probably benign.

## 2019-07-08 ENCOUNTER — Ambulatory Visit: Payer: BC Managed Care – PPO | Attending: Internal Medicine

## 2019-07-08 DIAGNOSIS — Z23 Encounter for immunization: Secondary | ICD-10-CM | POA: Insufficient documentation

## 2019-07-08 NOTE — Progress Notes (Signed)
   Covid-19 Vaccination Clinic  Name:  Cheryl Woodard    MRN: [id number] DOB: [birth year]  07/08/2019  Cheryl Woodard was observed post Covid-19 immunization for 15 minutes without incident. She was provided with Vaccine Information Sheet and instruction to access the V-Safe system.   Cheryl Woodard was instructed to call 911 with any severe reactions post vaccine: Marland Kitchen Difficulty breathing  . Swelling of face and throat  . A fast heartbeat  . A bad rash all over body  . Dizziness and weakness   Immunizations Administered    Name Date Dose VIS Date Route   Pfizer COVID-19 Vaccine 07/08/2019 11:51 AM 0.3 mL 04/14/2019 Intramuscular   Manufacturer: [place]   Lot: [id number]   [place]: [id number]

## 2019-07-29 ENCOUNTER — Ambulatory Visit: Payer: BC Managed Care – PPO | Attending: Internal Medicine

## 2019-07-29 DIAGNOSIS — Z23 Encounter for immunization: Secondary | ICD-10-CM

## 2019-07-29 NOTE — Progress Notes (Signed)
   Covid-19 Vaccination Clinic  Name:  Cheryl Woodard    MRN: [id number] DOB: July 28, 1975  07/29/2019  [name] was observed post Covid-19 immunization for 15 minutes without incident. She was provided with Vaccine Information Sheet and instruction to access the V-Safe system.   [name] was instructed to call 911 with any severe reactions post vaccine: Marland Kitchen Difficulty breathing  . Swelling of face and throat  . A fast heartbeat  . A bad rash all over body  . Dizziness and weakness   Immunizations Administered    Name Date Dose VIS Date Route   Pfizer COVID-19 Vaccine 07/29/2019 10:12 AM 0.3 mL 04/14/2019 Intramuscular   Manufacturer: [employer]   Lot: [id number]   [place]: [id number]

## 2020-02-06 DIAGNOSIS — K602 Anal fissure, unspecified: Secondary | ICD-10-CM | POA: Insufficient documentation

## 2020-12-16 ENCOUNTER — Encounter: Payer: Self-pay | Admitting: Podiatry

## 2020-12-16 ENCOUNTER — Other Ambulatory Visit: Payer: Self-pay

## 2020-12-16 ENCOUNTER — Ambulatory Visit (INDEPENDENT_AMBULATORY_CARE_PROVIDER_SITE_OTHER): Payer: Self-pay | Admitting: Podiatry

## 2020-12-16 ENCOUNTER — Ambulatory Visit (INDEPENDENT_AMBULATORY_CARE_PROVIDER_SITE_OTHER): Payer: BC Managed Care – PPO

## 2020-12-16 DIAGNOSIS — K625 Hemorrhage of anus and rectum: Secondary | ICD-10-CM | POA: Insufficient documentation

## 2020-12-16 DIAGNOSIS — D3613 Benign neoplasm of peripheral nerves and autonomic nervous system of lower limb, including hip: Secondary | ICD-10-CM

## 2020-12-16 DIAGNOSIS — M778 Other enthesopathies, not elsewhere classified: Secondary | ICD-10-CM

## 2020-12-16 DIAGNOSIS — G5762 Lesion of plantar nerve, left lower limb: Secondary | ICD-10-CM

## 2020-12-16 MED ORDER — MELOXICAM 15 MG PO TABS: 15.0000 mg | ORAL_TABLET | Freq: Every day | ORAL | 0 refills | Status: AC

## 2020-12-20 NOTE — Progress Notes (Signed)
Subjective:   Patient ID: Cheryl Woodard, female   DOB: 45 y.o.   MRN: [id number]   HPI 45 year old female presents the office today with concerns of pain to her left foot and getting worse over last 2 months.  She states that it feels "jammed".  Previously has been swelling.  Previously she had been placed into a boot without any help when she saw orthopedics.  She was told that she may have had a fracture toe but the symptoms have continued.  No injury that she reports.  No other concerns.   Review of Systems  All other systems reviewed and are negative.  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 Benson [place]   KNEE SURGERY  2010   arthroscopic and lateral release of L knee   WISDOM TOOTH EXTRACTION       Current Outpatient Medications:    amphetamine-dextroamphetamine (ADDERALL XR) 20 MG 24 hr capsule, Take by mouth., Disp: , Rfl:    meloxicam (MOBIC) 15 MG tablet, Take 1 tablet (15 mg total) by mouth daily., Disp: 30 tablet, Rfl: 0   sertraline (ZOLOFT) 50 MG tablet, Take 50 mg by mouth daily., Disp: , Rfl:   No Known Allergies        Objective:  Physical Exam  General: AAO x3, NAD  Dermatological: Skin is warm, dry and supple bilateral. There are no open sores, no preulcerative lesions, no rash or signs of infection present.  Vascular: Dorsalis Pedis artery and Posterior Tibial artery pedal pulses are 2/4 bilateral with immedate capillary fill time. here is no pain with calf compression, swelling, warmth, erythema.   Neruologic: Grossly intact via light touch bilateral.   Musculoskeletal: There is tenderness palpation on second interspace.  Small palpable mass is noted which could be a fibroma versus bursa.   There is no area of pinpoint tenderness.  No pain with range of motion of the MPJ.  Muscular strength 5/5 in all groups tested bilateral.  Gait: Unassisted, Nonantalgic.       Assessment:   Capsulitis, concern for possible neuroma/nerve impingement left foot     Plan:  -Treatment options discussed including all alternatives, risks, and complications -Etiology of symptoms were discussed -X-rays were obtained and reviewed with the patient.  No evidence of acute fracture or stress fracture.  Close approximation of the second third metatarsal heads. -We discussed your injection today.  Prescribe meloxicam.  Dispensed metatarsal offloading pads help support the second third metatarsal heads.  Discussed supportive shoes, arch support.  Trula Slade DPM

## 2020-12-24 ENCOUNTER — Other Ambulatory Visit: Payer: Self-pay | Admitting: Podiatry

## 2020-12-24 DIAGNOSIS — M778 Other enthesopathies, not elsewhere classified: Secondary | ICD-10-CM

## 2020-12-24 DIAGNOSIS — D3613 Benign neoplasm of peripheral nerves and autonomic nervous system of lower limb, including hip: Secondary | ICD-10-CM

## 2021-03-24 ENCOUNTER — Ambulatory Visit (INDEPENDENT_AMBULATORY_CARE_PROVIDER_SITE_OTHER): Payer: BC Managed Care – PPO

## 2021-03-24 ENCOUNTER — Ambulatory Visit: Payer: BC Managed Care – PPO | Admitting: Podiatry

## 2021-03-24 ENCOUNTER — Other Ambulatory Visit: Payer: Self-pay

## 2021-03-24 DIAGNOSIS — G5762 Lesion of plantar nerve, left lower limb: Secondary | ICD-10-CM

## 2021-03-24 DIAGNOSIS — M778 Other enthesopathies, not elsewhere classified: Secondary | ICD-10-CM

## 2021-03-24 MED ORDER — METHYLPREDNISOLONE 4 MG PO TBPK: ORAL_TABLET | ORAL | 0 refills | Status: AC

## 2021-03-30 NOTE — Progress Notes (Signed)
Subjective: 45 year old female presents the office today for concerns of continued discomfort of pain to her left foot.  She describes discomfort still under the toes and top of her foot mostly on the second interspace.  No recent injury or trauma any changes otherwise since I last saw her.  Objective: AAO x3, NAD DP/PT pulses palpable bilaterally, CRT less than 3 seconds Continuation tenderness musculoskeletal interspace.  Slight discomfort on the second third MPJs but the majority of it appears to be along the interspace.  There is minimal edema.  There is no erythema or warmth.  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  -Repeat x-rays obtained reviewed.  Old fracture noted on the phalanx but this was also noted at her appointment with [facility] on November 01, 2020.  At this point I think the fractures was causing her symptoms I think is more neuroma, nerve impingement. -Discussed your injection will be held off on this today.  Prescribed a Medrol Dosepak.  Add neuroma pad to the inserts.  If symptoms continue recommend MRI or ultrasound to further evaluate for neuroma but also ligamentous instability given the history of previous fracture. -Patient encouraged to call the office with any questions, concerns, change in symptoms.   Trula Slade DPM

## 2021-04-02 ENCOUNTER — Encounter: Payer: Self-pay | Admitting: Podiatry

## 2021-05-06 ENCOUNTER — Ambulatory Visit: Payer: BC Managed Care – PPO | Admitting: Podiatry

## 2021-05-06 ENCOUNTER — Other Ambulatory Visit: Payer: Self-pay

## 2021-05-06 DIAGNOSIS — G5762 Lesion of plantar nerve, left lower limb: Secondary | ICD-10-CM | POA: Diagnosis not present

## 2021-05-06 DIAGNOSIS — M778 Other enthesopathies, not elsewhere classified: Secondary | ICD-10-CM

## 2021-05-06 MED ORDER — IBUPROFEN 800 MG PO TABS: 800.0000 mg | ORAL_TABLET | Freq: Three times a day (TID) | ORAL | 0 refills | Status: AC | PRN

## 2021-05-11 NOTE — Progress Notes (Signed)
Subjective: 46 year old female presents the office today for concerns of continued discomfort of pain to her left foot.  She states that she is feeling better with some discomfort.  The pain has moved some.  Overall feeling better but still having discomfort.  She has no new concerns.  No recent injury.    Objective: AAO x3, NAD DP/PT pulses palpable bilaterally, CRT less than 3 seconds Continuation tenderness along the left foot second interspace.  Slight discomfort on the second third MPJs but the majority of it appears to be along the interspace.  There is minimal edema.  There is no erythema or warmth.  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  -Overall symptoms are somewhat improved but still remaining.  Reviewed the previous x-rays with her reveal old fracture but only this was causing her symptoms.  I would order an MRI to further evaluate.  Also prescribed ibuprofen 800 mg as needed.  Referral to physical therapy for neuroma to include likely dry needling.  Trula Slade DPM

## 2021-05-12 ENCOUNTER — Ambulatory Visit: Payer: BC Managed Care – PPO | Admitting: Podiatry

## 2021-05-13 ENCOUNTER — Other Ambulatory Visit: Payer: Self-pay

## 2021-05-13 DIAGNOSIS — G5762 Lesion of plantar nerve, left lower limb: Secondary | ICD-10-CM

## 2021-06-17 ENCOUNTER — Ambulatory Visit: Payer: BC Managed Care – PPO | Admitting: Podiatry

## 2021-10-02 ENCOUNTER — Encounter: Payer: Self-pay | Admitting: Podiatry

## 2021-10-02 NOTE — Telephone Encounter (Signed)
Leah or Ammie- can you please follow up on this MRI for her.

## 2021-10-03 NOTE — Telephone Encounter (Signed)
They have been trying to reach patient , made several attempts, will reach out again today to schedule.

## 2021-10-13 ENCOUNTER — Ambulatory Visit
Admission: RE | Admit: 2021-10-13 | Discharge: 2021-10-13 | Disposition: A | Discharge status: Home / Self Care | Payer: BC Managed Care – PPO | Source: Ambulatory Visit | Attending: Podiatry | Admitting: Podiatry

## 2021-10-13 DIAGNOSIS — G5762 Lesion of plantar nerve, left lower limb: Secondary | ICD-10-CM

## 2021-10-13 DIAGNOSIS — M778 Other enthesopathies, not elsewhere classified: Secondary | ICD-10-CM

## 2021-10-17 ENCOUNTER — Encounter: Payer: Self-pay | Admitting: Podiatry
